# Patient Record
Sex: Female | Born: 1975 | Race: Black or African American | Hispanic: No | Marital: Single | Smoking: Former smoker
Health system: Southern US, Community
[De-identification: ages and names within clinical notes are randomized; demographics above are authoritative.]

## PROBLEM LIST (undated history)

## (undated) DIAGNOSIS — I10 Essential (primary) hypertension: Secondary | ICD-10-CM

---

## 2015-03-18 ENCOUNTER — Emergency Department (HOSPITAL_COMMUNITY): Payer: Self-pay

## 2015-03-18 ENCOUNTER — Emergency Department (HOSPITAL_COMMUNITY)
Admission: EM | Admit: 2015-03-18 | Discharge: 2015-03-18 | Disposition: A | Discharge status: Home / Self Care | Payer: Self-pay | Attending: Emergency Medicine | Admitting: Emergency Medicine

## 2015-03-18 ENCOUNTER — Encounter (HOSPITAL_COMMUNITY): Payer: Self-pay | Admitting: Emergency Medicine

## 2015-03-18 DIAGNOSIS — Y9289 Other specified places as the place of occurrence of the external cause: Secondary | ICD-10-CM | POA: Insufficient documentation

## 2015-03-18 DIAGNOSIS — Y9389 Activity, other specified: Secondary | ICD-10-CM | POA: Insufficient documentation

## 2015-03-18 DIAGNOSIS — Y998 Other external cause status: Secondary | ICD-10-CM | POA: Insufficient documentation

## 2015-03-18 DIAGNOSIS — M546 Pain in thoracic spine: Secondary | ICD-10-CM

## 2015-03-18 DIAGNOSIS — Z87891 Personal history of nicotine dependence: Secondary | ICD-10-CM | POA: Insufficient documentation

## 2015-03-18 DIAGNOSIS — W19XXXA Unspecified fall, initial encounter: Secondary | ICD-10-CM

## 2015-03-18 DIAGNOSIS — S299XXA Unspecified injury of thorax, initial encounter: Secondary | ICD-10-CM | POA: Insufficient documentation

## 2015-03-18 DIAGNOSIS — W1839XA Other fall on same level, initial encounter: Secondary | ICD-10-CM | POA: Insufficient documentation

## 2015-03-18 DIAGNOSIS — I10 Essential (primary) hypertension: Secondary | ICD-10-CM | POA: Insufficient documentation

## 2015-03-18 HISTORY — DX: Essential (primary) hypertension: I10

## 2015-03-18 MED ORDER — CYCLOBENZAPRINE HCL 10 MG PO TABS: 10.0000 mg | ORAL_TABLET | Freq: Three times a day (TID) | ORAL | Status: AC | PRN

## 2015-03-18 MED ORDER — IBUPROFEN 800 MG PO TABS
800.0000 mg | ORAL_TABLET | Freq: Once | ORAL | Status: AC
  Administered 2015-03-18: 800 mg via ORAL
  Filled 2015-03-18: qty 1

## 2015-03-18 MED ORDER — IBUPROFEN 600 MG PO TABS: 600.0000 mg | ORAL_TABLET | Freq: Three times a day (TID) | ORAL | Status: AC | PRN

## 2015-03-18 MED ORDER — IBUPROFEN 400 MG PO TABS
400.0000 mg | ORAL_TABLET | Freq: Once | ORAL | Status: AC
  Filled 2015-03-18 (×2): qty 1

## 2015-03-18 NOTE — ED Notes (Signed)
Patient [facility] rehab facility.

## 2015-03-18 NOTE — ED Notes (Signed)
Pt arrives via [facility] from rehab facility, fell from set of bunk beds and hit the floor between radiator and dresser. No LOC, alert and oriented, VS WDL. Pt reports abrasion to L arm and upper back pain between shoulder blades. Pain 7/10.

## 2015-03-19 NOTE — ED Provider Notes (Signed)
CSN: [id number]     Arrival date & time 03/18/15  4098 History   First MD Initiated Contact with Patient 03/18/15 0531     Chief Complaint  Patient presents with  . Fall      HPI Patient presents the emergency department complaining of pain in her mid back after falling out of a bunk bed this evening.  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  She denies numbness or weakness in her arms or legs.  She denies neck pain.  No head injury or head trauma.  Denies use of anticoagulants.  No chest pain or shortness of breath.  Denies abdominal pain.   Past Medical History  Diagnosis Date  . Hypertension     patient states "i've never been diagnosed with that"   Past Surgical History  Procedure Laterality Date  . Cesarean section      in 1997   History reviewed. No pertinent family history. Social History  Substance Use Topics  . Smoking status: Former Smoker    Quit date: 10/16/2014  . Smokeless tobacco: None  . Alcohol Use: No   OB History    No data available     Review of Systems  All other systems reviewed and are negative.     Allergies  Review of patient's allergies indicates no known allergies.  Home Medications   Prior to Admission medications   Medication Sig Start Date End Date Taking? Authorizing Provider  cyclobenzaprine (FLEXERIL) 10 MG tablet Take 1 tablet (10 mg total) by mouth 3 (three) times daily as needed for muscle spasms. 03/18/15   Azalia Bilis, MD  ibuprofen (ADVIL,MOTRIN) 600 MG tablet Take 1 tablet (600 mg total) by mouth every 8 (eight) hours as needed. 03/18/15   Azalia Bilis, MD   BP 122/81 mmHg  Pulse 70  Resp 16  Ht  (1.702 m)  Wt 210 lb (95.255 kg)  BMI 32.88 kg/m2  SpO2 96%  LMP  (LMP Unknown) Physical Exam  Constitutional: She is oriented to person, place, and time. She appears well-developed and well-nourished. No distress.  HENT:  Head: Normocephalic and atraumatic.  Eyes: EOM  are normal.  Neck: Neck supple.  Mild cervical tenderness without stepoff  Cardiovascular: Normal rate, regular rhythm and normal heart sounds.   Pulmonary/Chest: Effort normal and breath sounds normal.  Abdominal: Soft. She exhibits no distension. There is no tenderness.  Musculoskeletal: Normal range of motion.  Full range of motion bilateral hips, knees, ankles.  Normal strength in arms and legs bilaterally.  Mild thoracic tenderness without thoracic step-off.  No lumbar tenderness  Neurological: She is alert and oriented to person, place, and time.  Skin: Skin is warm and dry.  Psychiatric: She has a normal mood and affect. Judgment normal.  Nursing note and vitals reviewed.   ED Course  Procedures (including critical care time) Labs Review Labs Reviewed - No data to display  Imaging Review Dg Cervical Spine Complete  03/18/2015   CLINICAL DATA:  Larey Seat from upper bunk bed tonight, RIGHT neck pain.  EXAM: CERVICAL SPINE  4+ VIEWS  COMPARISON:  None.  FINDINGS: Cervical vertebral bodies and posterior elements appear intact and aligned to the inferior endplate of C7, the most caudal well visualized level. Straightened cervical lordosis. Minimal RIGHT C3-4 and C4-5 neural foraminal narrowing. Intervertebral disc heights preserved, with mild ventral endplate spurring J1-9 and C6-7. No destructive bony lesions. Lateral masses in alignment. Prevertebral and paraspinal  soft tissue planes are nonsuspicious.  IMPRESSION: No acute fracture deformity or malalignment.   Electronically Signed   By: Awilda Metro M.D.   On: 03/18/2015 06:40   Dg Thoracic Spine W/swimmers  03/18/2015   CLINICAL DATA:  Patient fell from upper bunk bed. Pain in the right side of the upper back. Neck is sore.  EXAM: THORACIC SPINE - 3 VIEWS  COMPARISON:  None.  FINDINGS: Thoracic curvature with convexity towards the left. This could be positional or due to muscle spasm or chronic scoliosis. Degenerative changes in the  thoracic spine with narrowed interspaces and endplate hypertrophic changes. No vertebral compression deformities. No focal bone lesion or bone destruction. No paraspinal soft tissue swelling.  IMPRESSION: Thoracic convexity towards the left which may be positional or due to muscle spasm or scoliosis. Degenerative changes. No acute displaced fractures identified.   Electronically Signed   By: Burman Nieves M.D.   On: 03/18/2015 06:50   I have personally reviewed and evaluated these images and lab results as part of my medical decision-making.   EKG Interpretation None      MDM   Final diagnoses:  Fall, initial encounter  Thoracic back pain, unspecified back pain laterality    Home with anti-inflammatories and muscle relaxants.  Plain films negative.  Chest and abdomen benign.  Discharge home in good condition    Azalia Bilis, MD 03/19/15 [phone]

## 2016-04-08 IMAGING — CR DG CERVICAL SPINE COMPLETE 4+V
6 series · 6 of 6 positions shown · non-contrast
Comparison: None.

CLINICAL DATA: Fell from upper bunk bed tonight, RIGHT neck pain.

EXAM:
CERVICAL SPINE  4+ VIEWS

[c-spine lat]
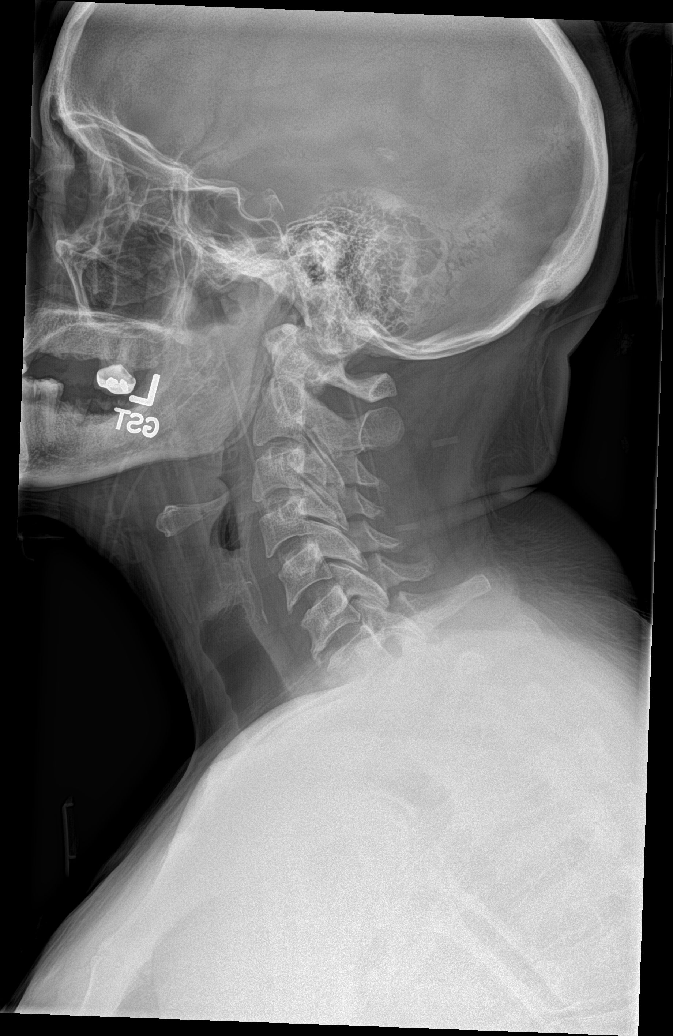

[c-spine obl (1 of 2)]
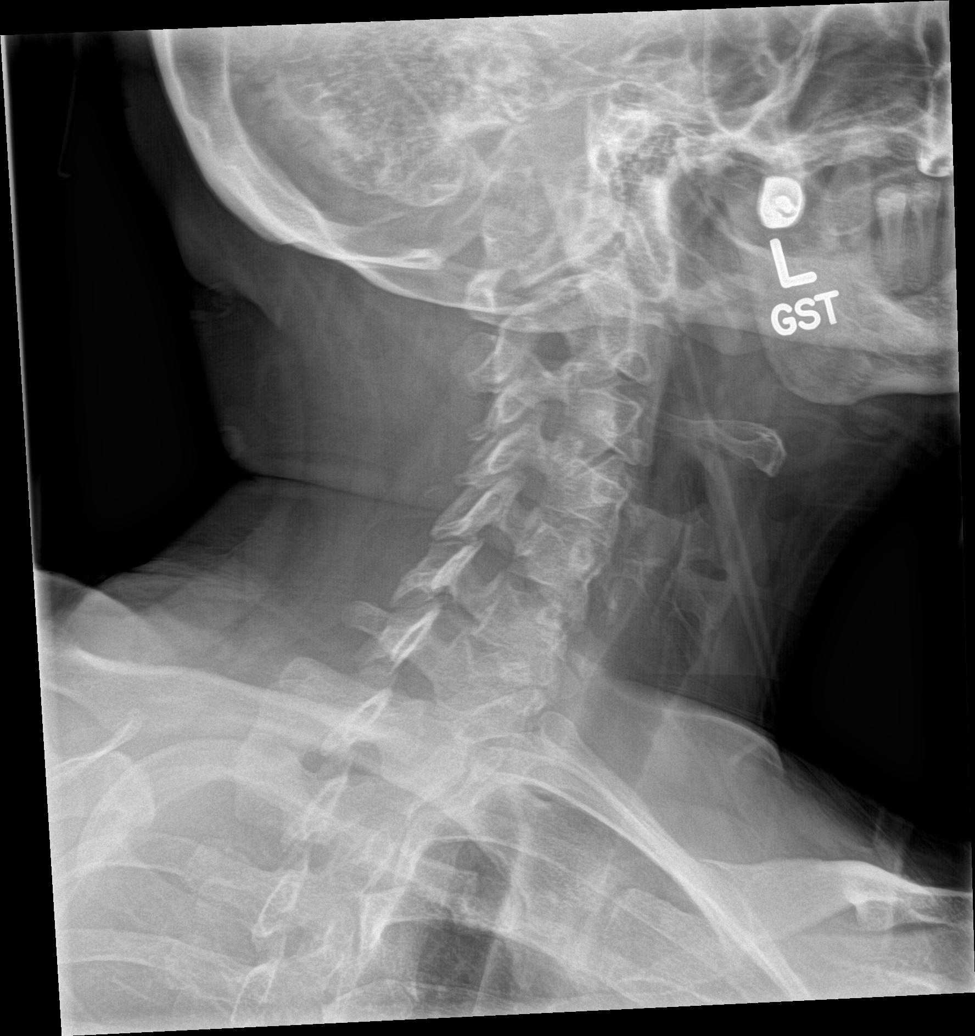

[c-spine obl (2 of 2)]
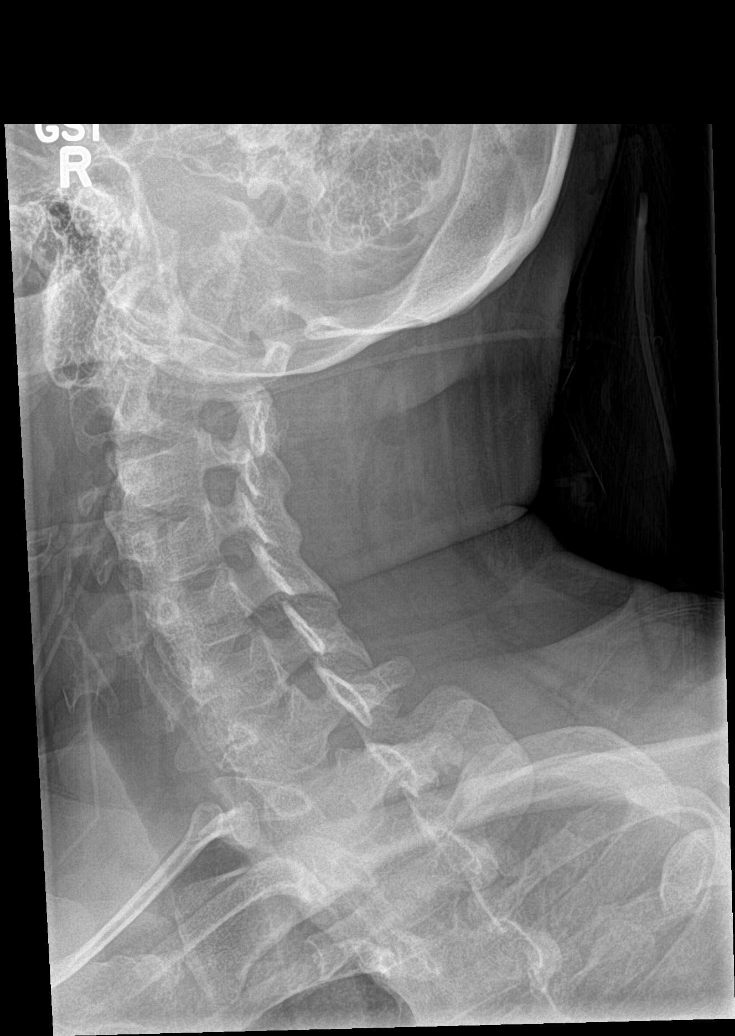

[c-spine ap]
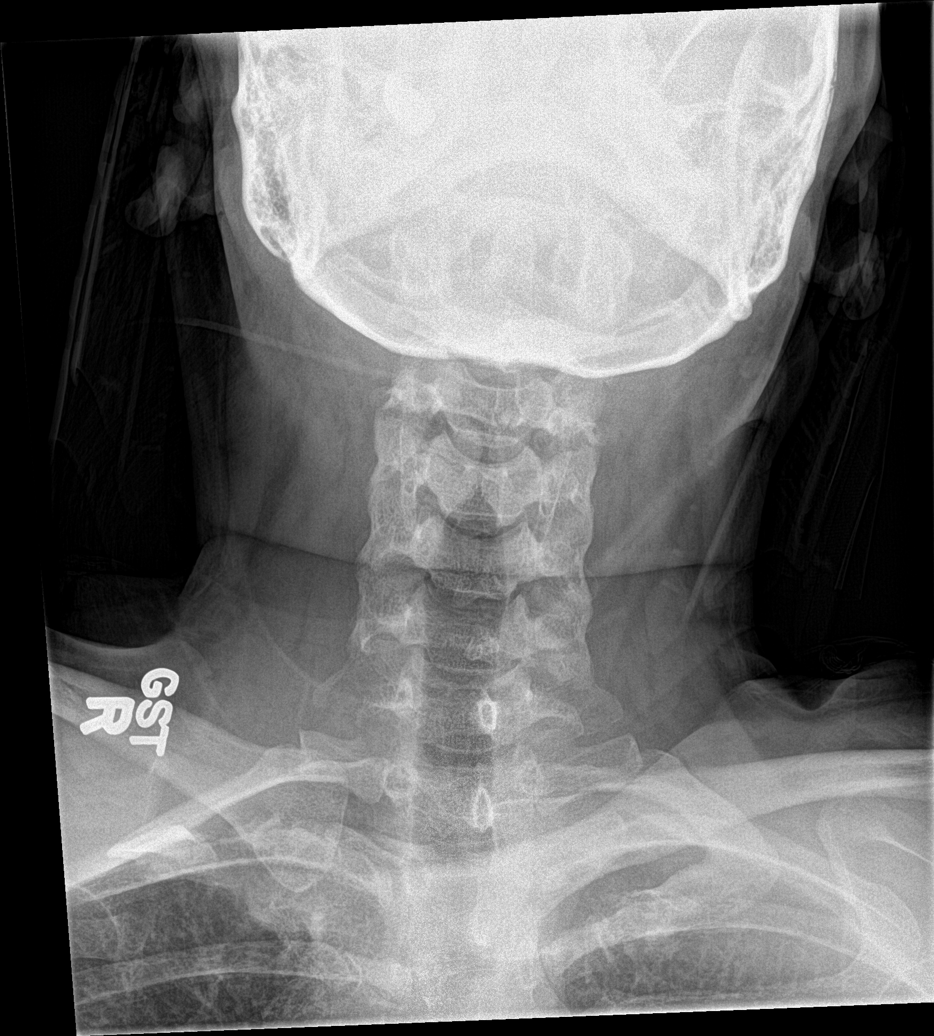

[c-spine open mouth (1 of 2)]
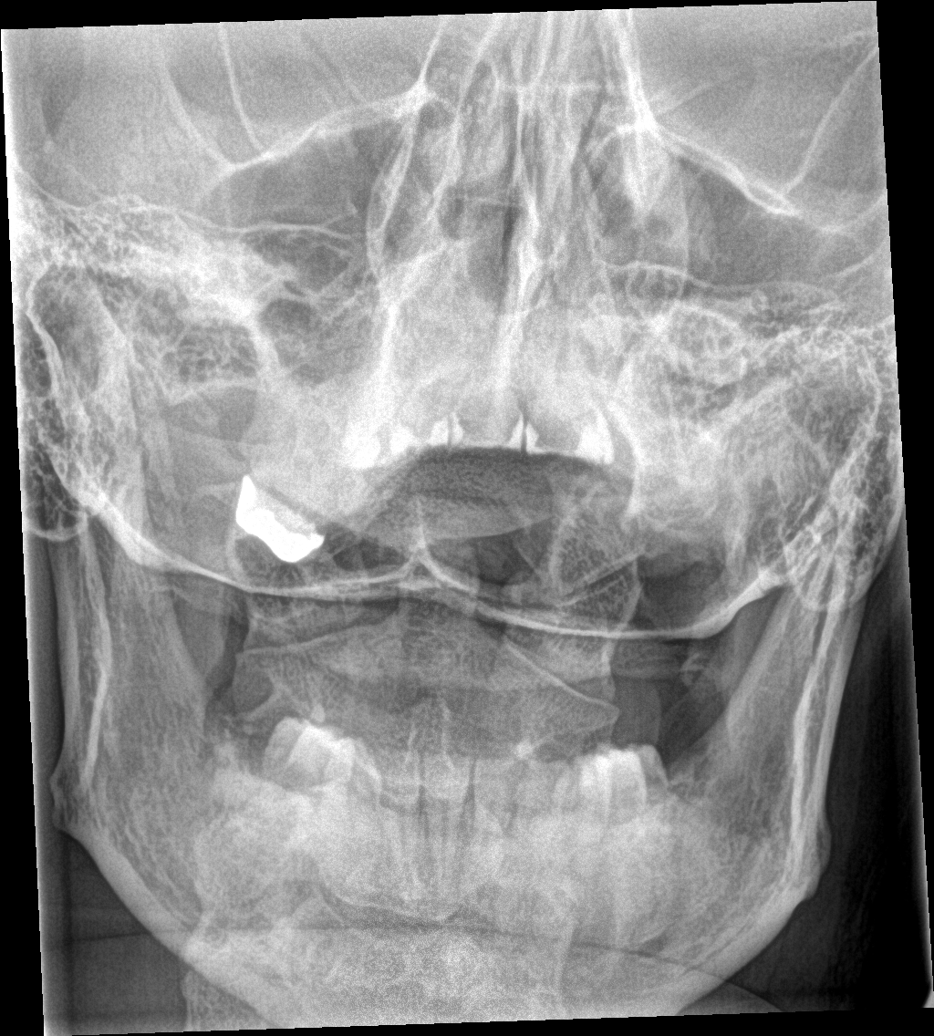

[c-spine open mouth (2 of 2)]
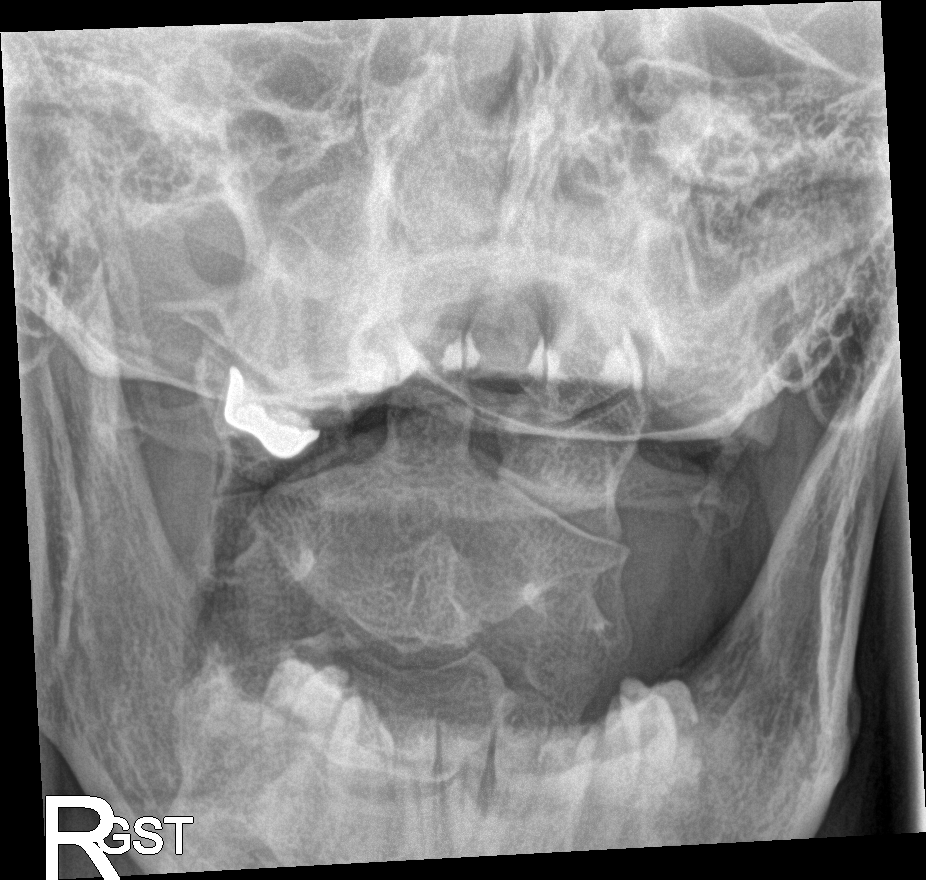

[6 of 6 positions shown; findings below may reference images not displayed]

FINDINGS: Cervical vertebral bodies and posterior elements appear intact and
aligned to the inferior endplate of C7, the most caudal well
visualized level. Straightened cervical lordosis. Minimal RIGHT C3-4
and C4-5 neural foraminal narrowing. Intervertebral disc heights
preserved, with mild ventral endplate spurring C5-6 and C6-7. No
destructive bony lesions. Lateral masses in alignment. Prevertebral
and paraspinal soft tissue planes are nonsuspicious.
IMPRESSION: No acute fracture deformity or malalignment.

## 2016-04-08 IMAGING — CR DG THORACIC SPINE 3V
3 series · 3 of 3 positions shown · non-contrast
Comparison: None.

CLINICAL DATA: Patient fell from upper bunk bed. Pain in the right
side of the upper back. Neck is sore.

EXAM:
THORACIC SPINE - 3 VIEWS

[t-spine ap]
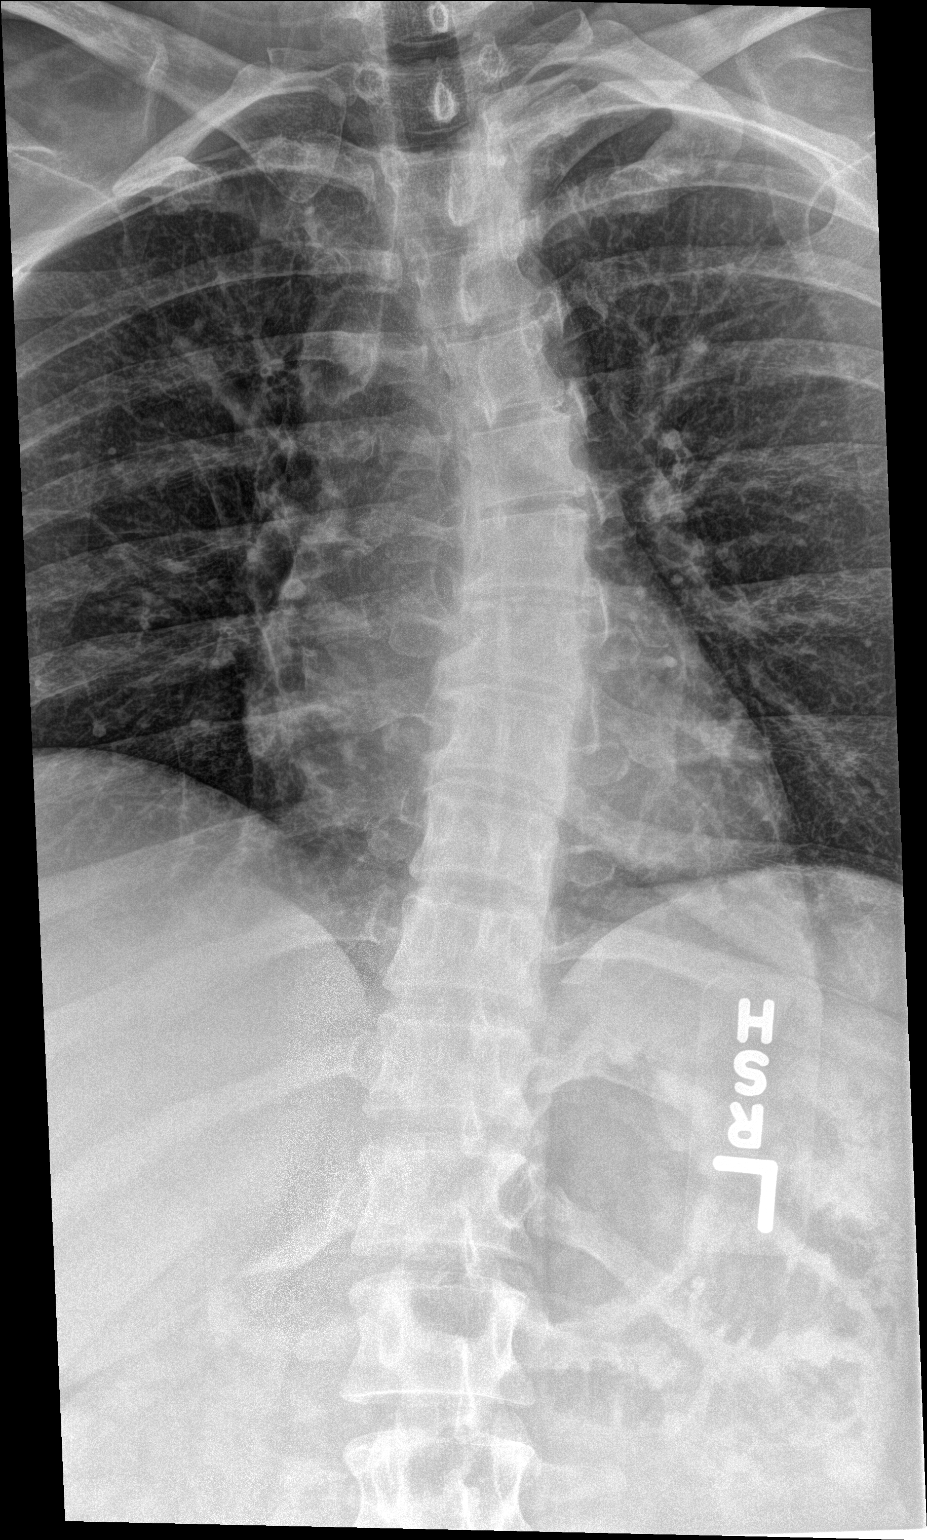

[t-spine lat]
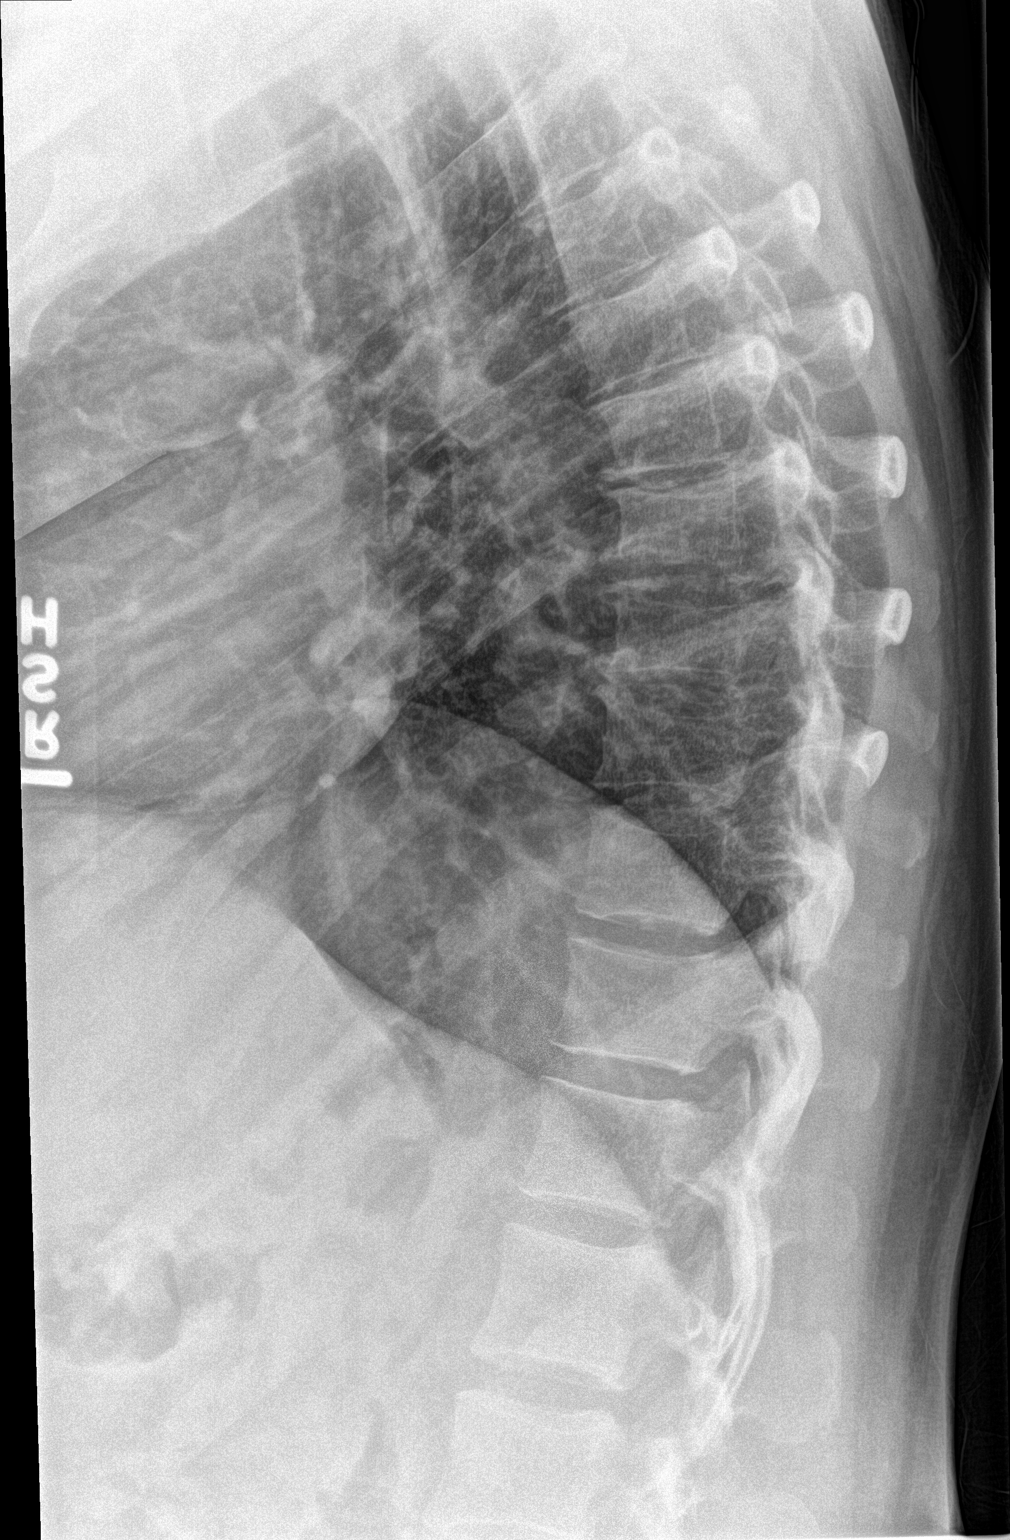

[t-spine swimmers]
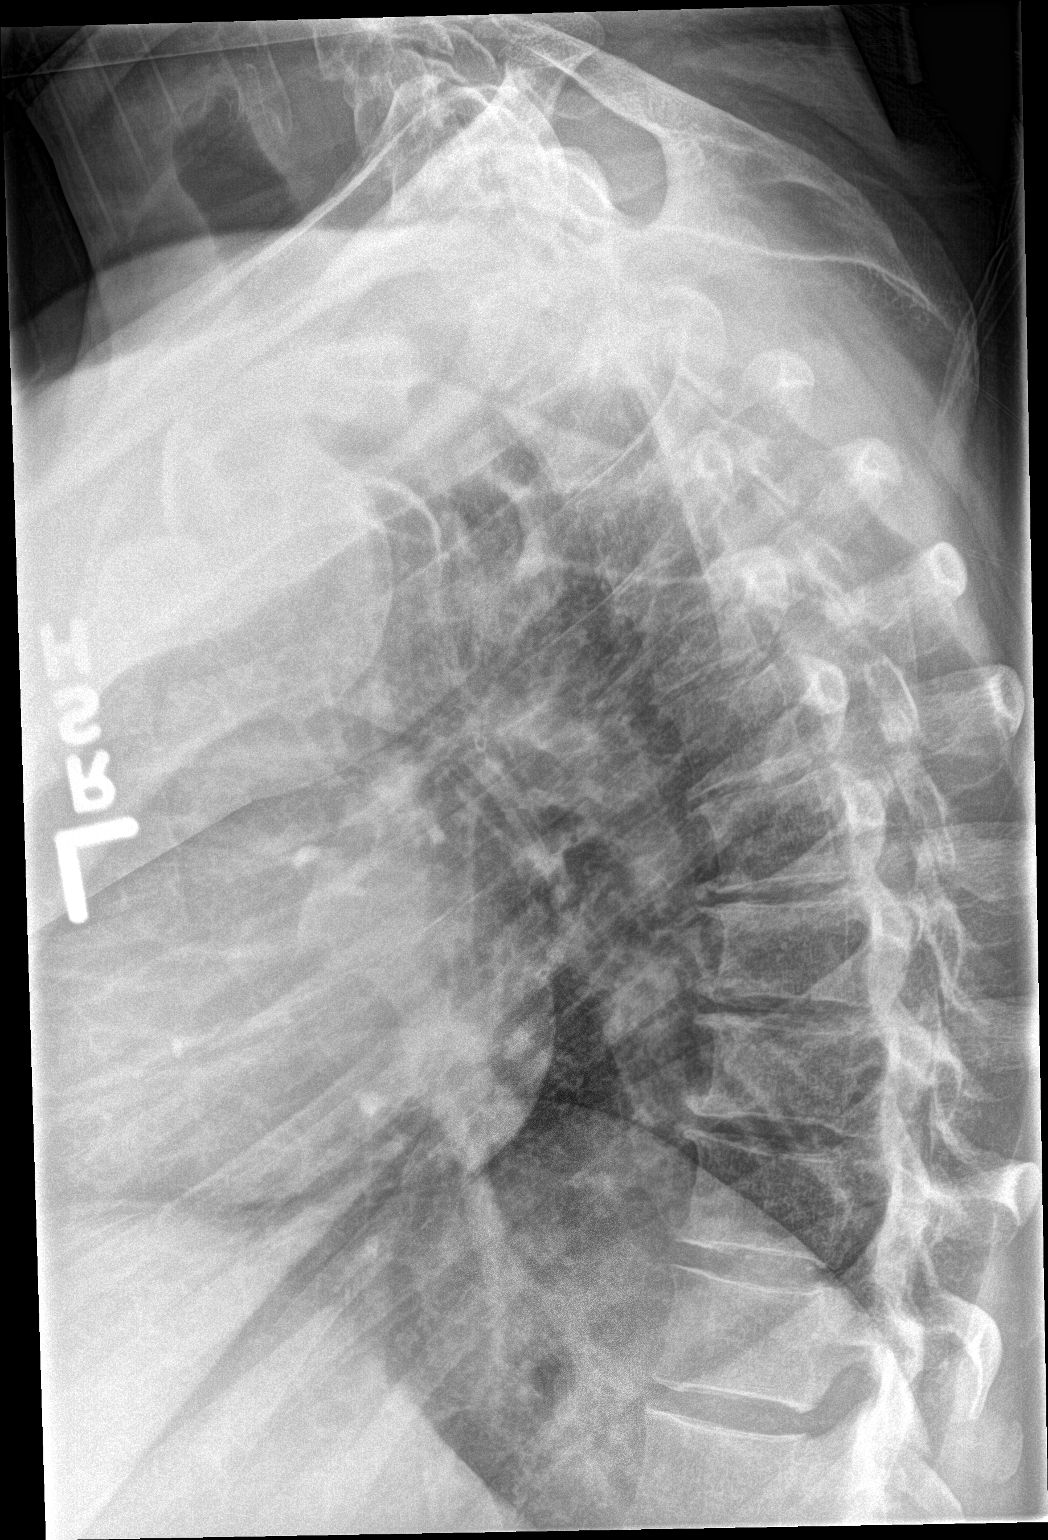

[3 of 3 positions shown; findings below may reference images not displayed]

FINDINGS: Thoracic curvature with convexity towards the left. This could be
positional or due to muscle spasm or chronic scoliosis. Degenerative
changes in the thoracic spine with narrowed interspaces and endplate
hypertrophic changes. No vertebral compression deformities. No focal
bone lesion or bone destruction. No paraspinal soft tissue swelling.
IMPRESSION: Thoracic convexity towards the left which may be positional or due
to muscle spasm or scoliosis. Degenerative changes. No acute
displaced fractures identified.
